# Patient Record
Sex: Male | Born: 1980 | Race: Black or African American | Hispanic: No | Marital: Single | Smoking: Current every day smoker
Health system: Southern US, Community
[De-identification: ages and names within clinical notes are randomized; demographics above are authoritative.]

---

## 2012-11-01 ENCOUNTER — Emergency Department: Payer: Self-pay | Admitting: Emergency Medicine

## 2012-11-02 LAB — BETA STREP CULTURE(ARMC)

## 2014-06-27 ENCOUNTER — Emergency Department: Payer: Self-pay | Admitting: Emergency Medicine

## 2014-06-30 ENCOUNTER — Emergency Department: Payer: Self-pay | Admitting: Emergency Medicine

## 2015-09-19 ENCOUNTER — Emergency Department: Payer: Self-pay

## 2015-09-19 ENCOUNTER — Encounter: Payer: Self-pay | Admitting: Emergency Medicine

## 2015-09-19 ENCOUNTER — Emergency Department
Admission: EM | Admit: 2015-09-19 | Discharge: 2015-09-19 | Disposition: A | Discharge status: Home / Self Care | Payer: Self-pay | Attending: Emergency Medicine | Admitting: Emergency Medicine

## 2015-09-19 DIAGNOSIS — J209 Acute bronchitis, unspecified: Secondary | ICD-10-CM | POA: Insufficient documentation

## 2015-09-19 DIAGNOSIS — M791 Myalgia: Secondary | ICD-10-CM | POA: Insufficient documentation

## 2015-09-19 DIAGNOSIS — R61 Generalized hyperhidrosis: Secondary | ICD-10-CM | POA: Insufficient documentation

## 2015-09-19 LAB — RAPID INFLUENZA A&B ANTIGENS: Influenza B (ARMC): NOT DETECTED

## 2015-09-19 LAB — RAPID INFLUENZA A&B ANTIGENS (ARMC ONLY): INFLUENZA A (ARMC): NOT DETECTED

## 2015-09-19 MED ORDER — IPRATROPIUM-ALBUTEROL 0.5-2.5 (3) MG/3ML IN SOLN
3.0000 mL | Freq: Once | RESPIRATORY_TRACT | Status: AC
  Administered 2015-09-19: 3 mL via RESPIRATORY_TRACT
  Filled 2015-09-19: qty 3

## 2015-09-19 MED ORDER — KETOROLAC TROMETHAMINE 30 MG/ML IJ SOLN
30.0000 mg | Freq: Once | INTRAMUSCULAR | Status: AC
  Administered 2015-09-19: 30 mg via INTRAMUSCULAR

## 2015-09-19 MED ORDER — KETOROLAC TROMETHAMINE 30 MG/ML IJ SOLN
30.0000 mg | Freq: Once | INTRAMUSCULAR | Status: DC
  Filled 2015-09-19: qty 1

## 2015-09-19 MED ORDER — NAPROXEN 500 MG PO TABS
500.0000 mg | ORAL_TABLET | Freq: Two times a day (BID) | ORAL | Status: DC
Start: 2015-09-19 — End: 2016-05-02

## 2015-09-19 MED ORDER — ALBUTEROL SULFATE HFA 108 (90 BASE) MCG/ACT IN AERS: 2.0000 | INHALATION_SPRAY | RESPIRATORY_TRACT | Status: DC | PRN

## 2015-09-19 MED ORDER — ONDANSETRON 8 MG PO TBDP
8.0000 mg | ORAL_TABLET | Freq: Once | ORAL | Status: AC
  Administered 2015-09-19: 8 mg via ORAL
  Filled 2015-09-19: qty 1

## 2015-09-19 MED ORDER — AZITHROMYCIN 250 MG PO TABS: ORAL_TABLET | ORAL | Status: DC

## 2015-09-19 MED ORDER — DEXAMETHASONE SODIUM PHOSPHATE 10 MG/ML IJ SOLN
10.0000 mg | Freq: Once | INTRAMUSCULAR | Status: AC
  Administered 2015-09-19: 10 mg via INTRAMUSCULAR
  Filled 2015-09-19: qty 1

## 2015-09-19 NOTE — ED Notes (Signed)
Reports cough and congestion and fever

## 2015-09-19 NOTE — Discharge Instructions (Signed)

## 2015-09-19 NOTE — ED Provider Notes (Signed)
[facility] Emergency Department Provider Note  ____________________________________________  Time seen: 5:20 PM  I have reviewed the triage vital signs and the nursing notes.   HISTORY  Chief Complaint Cough    HPI Steven Caldwell is a 35 y.o. male who complains of body aches nausea vomiting sweats and feeling flushed. He also reports some abdominal cramping. Feels like he has the flu. Also having a nonproductive cough.     History reviewed. No pertinent past medical history.   There are no active problems to display for this patient.    History reviewed. No pertinent past surgical history.   Current Outpatient Rx  Name  Route  Sig  Dispense  Refill  . albuterol (PROVENTIL HFA) 108 (90 Base) MCG/ACT inhaler   Inhalation   Inhale 2 puffs into the lungs every 4 (four) hours as needed for wheezing or shortness of breath.   1 Inhaler   0   . azithromycin (ZITHROMAX Z-PAK) 250 MG tablet      Take 2 tablets (500 mg) on  Day 1,  followed by 1 tablet (250 mg) once daily on Days 2 through 5.   6 each   0   . naproxen (NAPROSYN) 500 MG tablet   Oral   Take 1 tablet (500 mg total) by mouth 2 (two) times daily with a meal.   20 tablet   0      Allergies Review of patient's allergies indicates no known allergies.   History reviewed. No pertinent family history.  Social History Social History  Substance Use Topics  . Smoking status: Never Smoker   . Smokeless tobacco: None  . Alcohol Use: None    Review of Systems  Constitutional:   Positive fever and chills. Positive sweats.. No weight changes Eyes:   No blurry vision or double vision.  ENT:   As a sore throat and runny nose  Cardiovascular:   No chest pain. Respiratory:   Positive shortness of breath and nonproductive cough. Gastrointestinal:   Negative for abdominal pain, vomiting and diarrhea.  No BRBPR or melena. Genitourinary:   Negative for dysuria or difficulty  urinating. Musculoskeletal:   Positive diffuse myalgias Skin:   Negative for rash. Neurological:   Negative for headaches, focal weakness or numbness. Psychiatric:  No anxiety or depression.   Endocrine:  No changes in energy or sleep difficulty.  10-point ROS otherwise negative.  ____________________________________________   PHYSICAL EXAM:  VITAL SIGNS: ED Triage Vitals  Enc Vitals Group     BP 09/19/15 1710 125/85 mmHg     Pulse Rate 09/19/15 1710 87     Resp 09/19/15 1710 18     Temp 09/19/15 1710 98.9 F (37.2 C)     Temp Source 09/19/15 1710 Oral     SpO2 09/19/15 1710 99 %     Weight 09/19/15 1710 230 lb (104.327 kg)     Height 09/19/15 1710 6' (1.829 m)     Head Cir --      Peak Flow --      Pain Score 09/19/15 1714 5     Pain Loc --      Pain Edu? --      Excl. in GC? --     Vital signs reviewed, nursing assessments reviewed.   Constitutional:   Alert and oriented. Well appearing and in no distress. Eyes:   No scleral icterus. No conjunctival pallor. PERRL. EOMI ENT   Head:   Normocephalic and atraumatic.  Nose:   No congestion/rhinnorhea. No septal hematoma   Mouth/Throat:   MMM, positive pharyngeal erythema. No peritonsillar mass.    Neck:   No stridor. No SubQ emphysema. No meningismus. Hematological/Lymphatic/Immunilogical:   No cervical lymphadenopathy. Cardiovascular:   RRR. Symmetric bilateral radial and DP pulses.  No murmurs.  Respiratory:   Normal respiratory effort without tachypnea nor retractions. Breath sounds are clear and equal bilaterally. No wheezes/rales/rhonchi with normal breathing, but with forceful rapid exhalation, there is inducible wheezing and coughing.. Gastrointestinal:   Soft and nontender. Non distended. There is no CVA tenderness.  No rebound, rigidity, or guarding. Genitourinary:   deferred Musculoskeletal:   Nontender with normal range of motion in all extremities. No joint effusions.  No lower extremity  tenderness.  No edema. Neurologic:   Normal speech and language.  CN 2-10 normal. Motor grossly intact. No gross focal neurologic deficits are appreciated.  Skin:    Skin is warm, dry and intact. No rash noted.  No petechiae, purpura, or bullae. Psychiatric:   Mood and affect are normal. No SI or hallucinations ____________________________________________    LABS (pertinent positives/negatives) (all labs ordered are listed, but only abnormal results are displayed) Labs Reviewed  RAPID INFLUENZA A&B ANTIGENS ([facility] ONLY)   ____________________________________________   EKG    ____________________________________________    RADIOLOGY  Chest x-ray unremarkable  ____________________________________________   PROCEDURES   ____________________________________________   INITIAL IMPRESSION / ASSESSMENT AND PLAN / ED COURSE  Pertinent labs & imaging results that were available during my care of the patient were reviewed by me and considered in my medical decision making (see chart for details).  Patient presents with constellation of symptoms that is highly suspicious for viral upper respiratory infection and acute bronchitis, possibly influenza. We'll check flu swab and chest x-ray. DuoNeb's Decadron and Toradol.  ----------------------------------------- 6:59 PM on 09/19/2015 -----------------------------------------  Workup negative. We will prescribe NSAIDs albuterol and Z-Pak. Follow-up with primary care. Patient's well-appearing nontoxic, vital signs are normal. No evidence of sepsis. Low suspicion for any acute intra-abdominal pathology. Tolerating oral intake.     ____________________________________________   FINAL CLINICAL IMPRESSION(S) / ED DIAGNOSES  Final diagnoses:  Acute bronchitis, unspecified organism      Sharman Cheek, MD 09/19/15 1859

## 2015-09-19 NOTE — ED Notes (Signed)
Patient states that he has had body aches, vomiting, sweating, and "fever in my face". Patient also states that his "stomach has been locking up." Reports that he has not ate since Tuesday but that he is able to keep fluids down.

## 2016-03-11 ENCOUNTER — Emergency Department
Admission: EM | Admit: 2016-03-11 | Discharge: 2016-03-11 | Disposition: A | Discharge status: Home / Self Care | Payer: Self-pay | Attending: Emergency Medicine | Admitting: Emergency Medicine

## 2016-03-11 ENCOUNTER — Encounter: Payer: Self-pay | Admitting: Emergency Medicine

## 2016-03-11 DIAGNOSIS — B350 Tinea barbae and tinea capitis: Secondary | ICD-10-CM | POA: Insufficient documentation

## 2016-03-11 DIAGNOSIS — Z202 Contact with and (suspected) exposure to infections with a predominantly sexual mode of transmission: Secondary | ICD-10-CM | POA: Insufficient documentation

## 2016-03-11 DIAGNOSIS — F17210 Nicotine dependence, cigarettes, uncomplicated: Secondary | ICD-10-CM | POA: Insufficient documentation

## 2016-03-11 DIAGNOSIS — Z792 Long term (current) use of antibiotics: Secondary | ICD-10-CM | POA: Insufficient documentation

## 2016-03-11 DIAGNOSIS — R369 Urethral discharge, unspecified: Secondary | ICD-10-CM | POA: Insufficient documentation

## 2016-03-11 DIAGNOSIS — Z791 Long term (current) use of non-steroidal anti-inflammatories (NSAID): Secondary | ICD-10-CM | POA: Insufficient documentation

## 2016-03-11 LAB — URINALYSIS COMPLETE WITH MICROSCOPIC (ARMC ONLY)
Bacteria, UA: NONE SEEN
Bilirubin Urine: NEGATIVE
Glucose, UA: NEGATIVE mg/dL
KETONES UR: NEGATIVE mg/dL
NITRITE: NEGATIVE
PH: 5 (ref 5.0–8.0)
PROTEIN: 100 mg/dL — AB
SPECIFIC GRAVITY, URINE: 1.026 (ref 1.005–1.030)
Squamous Epithelial / LPF: NONE SEEN

## 2016-03-11 LAB — CHLAMYDIA/NGC RT PCR (ARMC ONLY)
Chlamydia Tr: DETECTED — AB
N gonorrhoeae: DETECTED — AB

## 2016-03-11 MED ORDER — CEFTRIAXONE SODIUM 250 MG IJ SOLR
250.0000 mg | Freq: Once | INTRAMUSCULAR | Status: AC
  Administered 2016-03-11: 250 mg via INTRAMUSCULAR
  Filled 2016-03-11: qty 250

## 2016-03-11 MED ORDER — AZITHROMYCIN 500 MG PO TABS
1000.0000 mg | ORAL_TABLET | Freq: Once | ORAL | Status: AC
  Administered 2016-03-11: 1000 mg via ORAL
  Filled 2016-03-11: qty 2

## 2016-03-11 MED ORDER — FLUCONAZOLE 150 MG PO TABS: 150.0000 mg | ORAL_TABLET | ORAL | 0 refills | Status: DC

## 2016-03-11 NOTE — ED Provider Notes (Signed)
[facility] Emergency Department Provider Note  ____________________________________________  Time seen: Approximately 4:08 PM  I have reviewed the triage vital signs and the nursing notes.   HISTORY  Chief Complaint Penile Discharge and Rash    HPI Steven Caldwell is a 35 y.o. male who presents to emergency department complaining of penile discharge and dysuria as well as a spreading skin lesion to the left side of the face. Per the patient discharge began yesterday.He reports burning with urination. He denies any polyuria, abdominal pain, nausea or vomiting, fevers or chills. Patient denies any known contact with STDs. Patient has not had these symptoms in the past. He has not tried any medications for these complaints prior to arrival.  Patient is also complaining of a skin lesion to the left side of the face/scalp. Patient reports that his child had ringworm and developed a similar-looking lesion to the left side of the face. He states that this has spread ingrown. He denies any drainage from site. He denies any pain. He reports the area is pruritic in nature. Only one lesion.    History reviewed. No pertinent past medical history.  There are no active problems to display for this patient.   History reviewed. No pertinent surgical history.  Prior to Admission medications   Medication Sig Start Date End Date Taking? Authorizing Provider  albuterol (PROVENTIL HFA) 108 (90 Base) MCG/ACT inhaler Inhale 2 puffs into the lungs every 4 (four) hours as needed for wheezing or shortness of breath. 09/19/15   Sharman CheekPhillip Stafford, MD  azithromycin (ZITHROMAX Z-PAK) 250 MG tablet Take 2 tablets (500 mg) on  Day 1,  followed by 1 tablet (250 mg) once daily on Days 2 through 5. 09/19/15   Sharman CheekPhillip Stafford, MD  fluconazole (DIFLUCAN) 150 MG tablet Take 1 tablet (150 mg total) by mouth once a week. 03/11/16   Delorise RoyalsJonathan D Emmaclaire Switala, PA-C  naproxen (NAPROSYN) 500 MG tablet Take 1  tablet (500 mg total) by mouth 2 (two) times daily with a meal. 09/19/15   Sharman CheekPhillip Stafford, MD    Allergies Review of patient's allergies indicates no known allergies.  No family history on file.  Social History Social History  Substance Use Topics  . Smoking status: Current Every Day Smoker    Packs/day: 1.00    Types: Cigarettes  . Smokeless tobacco: Never Used  . Alcohol use Yes     Comment: weekly     Review of Systems  Constitutional: No fever/chills Eyes: No visual changes.  ENT: No upper respiratory complaints. Cardiovascular: no chest pain. Respiratory: no cough. No SOB. Gastrointestinal: No abdominal pain.  No nausea, no vomiting.  No diarrhea.  No constipation. Genitourinary:Positive for penile discharge. Positive for dysuria. Negative for hematuria. No flank pain. Musculoskeletal: Negative for musculoskeletal pain. Skin: Positive for rash to the left side of face. Neurological: Negative for headaches, focal weakness or numbness. 10-point ROS otherwise negative.  ____________________________________________   PHYSICAL EXAM:  VITAL SIGNS: ED Triage Vitals  Enc Vitals Group     BP 03/11/16 1551 130/81     Pulse Rate 03/11/16 1551 94     Resp 03/11/16 1551 16     Temp 03/11/16 1551 98.1 F (36.7 C)     Temp Source 03/11/16 1551 Oral     SpO2 03/11/16 1551 98 %     Weight 03/11/16 1545 215 lb (97.5 kg)     Height 03/11/16 1545 6' (1.829 m)     Head Circumference --  Peak Flow --      Pain Score 03/11/16 1546 0     Pain Loc --      Pain Edu? --      Excl. in GC? --      Constitutional: Alert and oriented. Well appearing and in no acute distress. Eyes: Conjunctivae are normal. PERRL. EOMI. Head: Atraumatic. ENT:      Ears:       Nose: No congestion/rhinnorhea.      Mouth/Throat: Mucous membranes are moist.  Neck: No stridor.    Cardiovascular: Normal rate, regular rhythm. Normal S1 and S2.  Good peripheral circulation. Respiratory: Normal  respiratory effort without tachypnea or retractions. Lungs CTAB. Good air entry to the bases with no decreased or absent breath sounds. Gastrointestinal: Bowel sounds 4 quadrants. Soft and nontender to palpation. No guarding or rigidity. No palpable masses. No distention. No CVA tenderness. Genitourinary: No visible lesions. No chancres or sores. No discharge appreciated. No tenderness to palpation of testicles. Musculoskeletal: Full range of motion to all extremities. No gross deformities appreciated. Neurologic:  Normal speech and language. No gross focal neurologic deficits are appreciated.  Skin:  Skin is warm, dry and intact. Lesion noted to the left side of the scalp/face. Erythematous lesion. Edges are raised and scaly. Lesion is consistent with ringworm. Psychiatric: Mood and affect are normal. Speech and behavior are normal. Patient exhibits appropriate insight and judgement.   ____________________________________________   LABS (all labs ordered are listed, but only abnormal results are displayed)  Labs Reviewed  URINALYSIS COMPLETEWITH MICROSCOPIC ([facility] ONLY) - Abnormal; Notable for the following:       Result Value   Color, Urine YELLOW (*)    APPearance CLOUDY (*)    Hgb urine dipstick 1+ (*)    Protein, ur 100 (*)    Leukocytes, UA 3+ (*)    All other components within normal limits  CHLAMYDIA/NGC RT PCR ([facility] ONLY)   ____________________________________________  EKG   ____________________________________________  RADIOLOGY   No results found.  ____________________________________________    PROCEDURES  Procedure(s) performed:    Procedures    Medications  cefTRIAXone (ROCEPHIN) injection 250 mg (not administered)  azithromycin (ZITHROMAX) tablet 1,000 mg (not administered)     ____________________________________________   INITIAL IMPRESSION / ASSESSMENT AND PLAN / ED COURSE  Pertinent labs & imaging results that were available  during my care of the patient were reviewed by me and considered in my medical decision making (see chart for details).  Clinical Course    Patient's diagnosis is consistent with Ringworm of the scalp, penile discharge likely STD. Patient's urinalysis returns without any indication of UTI. Patient will be treated prophylactically for STD based off her symptomatology. Results from gonorrhea and chlamydia testing have not returned. When they do, we will call patient with results. Patient will follow-up with health Department in 3 weeks should results of gonorrhea and chlamydia returned positive. . Patient will be discharged home with prescriptions for fluconazole to be taken once weekly for 6 weeks for ringworm.  Patient is given ED precautions to return to the ED for any worsening or new symptoms.     ____________________________________________  FINAL CLINICAL IMPRESSION(S) / ED DIAGNOSES  Final diagnoses:  Ringworm of the scalp  Penile discharge  Possible exposure to STD      NEW MEDICATIONS STARTED DURING THIS VISIT:  New Prescriptions   FLUCONAZOLE (DIFLUCAN) 150 MG TABLET    Take 1 tablet (150 mg total) by mouth once a week.  This chart was dictated using voice recognition software/Dragon. Despite best efforts to proofread, errors can occur which can change the meaning. Any change was purely unintentional.    Racheal Patches, PA-C 03/11/16 1716    Myrna Blazer, MD 03/11/16 2106

## 2016-03-11 NOTE — ED Triage Notes (Signed)
Patient presents to the ED with a rash to his neck and his foot and penile discharge that began yesterday evening.  Patient reports discharge is green.  Denies foul odor.  Patient is giggling in triage.  Patient is in no obvious distress at this time.

## 2016-03-18 ENCOUNTER — Telehealth: Payer: Self-pay | Admitting: Emergency Medicine

## 2016-03-18 NOTE — Telephone Encounter (Signed)
Patient called because his girlfriend got a letter --I told him I had actually sent him a letter.  I explained positive gonorrhea and chlamydia tests and that he had been treated in the ED.

## 2016-05-02 ENCOUNTER — Emergency Department
Admission: EM | Admit: 2016-05-02 | Discharge: 2016-05-02 | Disposition: A | Discharge status: Home / Self Care | Payer: Self-pay | Attending: Student | Admitting: Student

## 2016-05-02 ENCOUNTER — Encounter: Payer: Self-pay | Admitting: Emergency Medicine

## 2016-05-02 DIAGNOSIS — R319 Hematuria, unspecified: Secondary | ICD-10-CM | POA: Insufficient documentation

## 2016-05-02 DIAGNOSIS — F17210 Nicotine dependence, cigarettes, uncomplicated: Secondary | ICD-10-CM | POA: Insufficient documentation

## 2016-05-02 DIAGNOSIS — J029 Acute pharyngitis, unspecified: Secondary | ICD-10-CM | POA: Insufficient documentation

## 2016-05-02 LAB — URINALYSIS COMPLETE WITH MICROSCOPIC (ARMC ONLY)
BILIRUBIN URINE: NEGATIVE
Bacteria, UA: NONE SEEN
GLUCOSE, UA: NEGATIVE mg/dL
HGB URINE DIPSTICK: NEGATIVE
Ketones, ur: NEGATIVE mg/dL
NITRITE: NEGATIVE
Protein, ur: 30 mg/dL — AB
SPECIFIC GRAVITY, URINE: 1.02 (ref 1.005–1.030)
pH: 8 (ref 5.0–8.0)

## 2016-05-02 LAB — POCT RAPID STREP A: STREPTOCOCCUS, GROUP A SCREEN (DIRECT): NEGATIVE

## 2016-05-02 MED ORDER — LIDOCAINE VISCOUS 2 % MT SOLN
10.0000 mL | OROMUCOSAL | 0 refills | Status: DC | PRN
Start: 2016-05-02 — End: 2019-03-15

## 2016-05-02 MED ORDER — AMOXICILLIN 875 MG PO TABS: 875.0000 mg | ORAL_TABLET | Freq: Two times a day (BID) | ORAL | 0 refills | Status: DC

## 2016-05-02 NOTE — ED Provider Notes (Signed)
[facility] Emergency Department Provider Note  ____________________________________________  Time seen: Approximately 10:00 AM  I have reviewed the triage vital signs and the nursing notes.   HISTORY  Chief Complaint Sore Throat    HPI Steven Caldwell is a 35 y.o. male , NAD, presents to the emergency department with 1 week history of sore throat. Patient states sore throat began one week ago with redness, swelling and white spots on the tonsils. States a friend gave him penicillin tablets to take which seemed to help for the first couple of days. States the sore throat returned this morning and he still notes redness and swelling but no white spots. Also notes he has had a rash about the side of his face which is new. Also reports since being in the emergency department he went to the restroom and noted blood in his urine with urinary hesitancy. Denies urethral discharge. States these symptoms have been going on for some time but states he is "hard headed" in regards to treatment and "does not like going to hospitals". Denies fevers, chills, body aches. Has had no headache, nasal congestion, ear pain, cough or chest congestion. No chest pain or shortness of breath. Denies abdominal pain, nausea or vomiting. No exposures to other sick contacts.   History reviewed. No pertinent past medical history.  There are no active problems to display for this patient.   No past surgical history on file.  Prior to Admission medications   Medication Sig Start Date End Date Taking? Authorizing Provider  amoxicillin (AMOXIL) 875 MG tablet Take 1 tablet (875 mg total) by mouth 2 (two) times daily. 05/02/16   Jami L Hagler, PA-C  lidocaine (XYLOCAINE) 2 % solution Use as directed 10 mLs in the mouth or throat every 4 (four) hours as needed for mouth pain. 05/02/16   Jami L Hagler, PA-C    Allergies Review of patient's allergies indicates no known allergies.  No family history  on file.  Social History Social History  Substance Use Topics  . Smoking status: Current Every Day Smoker    Packs/day: 1.00    Types: Cigarettes  . Smokeless tobacco: Never Used  . Alcohol use Yes     Comment: weekly     Review of Systems  Constitutional: No fever/chills ENT: Positive sore throat. Negative nasal congestion, runny nose, ear pain Cardiovascular: No chest pain. Respiratory: No cough or chest congestion. No shortness of breath.  Gastrointestinal: No abdominal pain.  No nausea, vomiting.   Genitourinary: Positive hematuria, urinary hesitancy. Negative for dysuria, urethral discharge. No urinary urgency or increased frequency. Musculoskeletal: Negative for back pain.  Skin: Positive for rash left side of face. Neurological: Negative for headaches, focal weakness or numbness. 10-point ROS otherwise negative.  ____________________________________________   PHYSICAL EXAM:  VITAL SIGNS: ED Triage Vitals  Enc Vitals Group     BP 05/02/16 0758 139/83     Pulse Rate 05/02/16 0758 (!) 58     Resp 05/02/16 0758 18     Temp 05/02/16 0758 97.4 F (36.3 C)     Temp src --      SpO2 05/02/16 0758 98 %     Weight 05/02/16 0758 215 lb (97.5 kg)     Height 05/02/16 0758 6' (1.829 m)     Head Circumference --      Peak Flow --      Pain Score 05/02/16 0759 6     Pain Loc --  Pain Edu? --      Excl. in GC? --      Constitutional: Alert and oriented. Well appearing and in no acute distress. Eyes: Conjunctivae are normal Without icterus or injection Head: Atraumatic. ENT:      Ears: TMs visualized bilaterally without erythema, effusion, bulging.      Nose: No congestion/rhinnorhea.      Mouth/Throat: Pharynx with moderate erythema but no significant swelling and no exudate. Uvula is midline. Airway is patent. Mucous membranes are moist.  Neck: Supple with full range of motion. Trachea midline. Hematological/Lymphatic/Immunilogical: Positive left, anterior,  focal cervical lymphadenopathy with mild tenderness to palpation but is mobile. Cardiovascular: Normal rate, regular rhythm. Normal S1 and S2.  Good peripheral circulation. Respiratory: Normal respiratory effort without tachypnea or retractions. Lungs CTAB with breath sounds noted in all lung fields. Gastrointestinal: Soft and nontender. No distention. No CVA tenderness. Musculoskeletal: No lower extremity tenderness nor edema.  No joint effusions. Neurologic:  Normal speech and language. No gross focal neurologic deficits are appreciated.  Skin:  Skin is warm, dry and intact. Papular rash noted about the left side of the face without significant erythema but evidence of excoriation. Psychiatric: Mood and affect are normal. Speech and behavior are normal. Patient exhibits appropriate insight and judgement.   ____________________________________________   LABS (all labs ordered are listed, but only abnormal results are displayed)  Labs Reviewed  URINALYSIS COMPLETEWITH MICROSCOPIC ([facility] ONLY) - Abnormal; Notable for the following:       Result Value   Color, Urine YELLOW (*)    APPearance CLEAR (*)    Protein, ur 30 (*)    Leukocytes, UA TRACE (*)    Squamous Epithelial / LPF 0-5 (*)    All other components within normal limits  CULTURE, GROUP A STREP [facility]  URINE CULTURE  POCT RAPID STREP A   ____________________________________________  EKG  None ____________________________________________  RADIOLOGY  None ____________________________________________    PROCEDURES  Procedure(s) performed: None   Procedures   Medications - No data to display   ____________________________________________   INITIAL IMPRESSION / ASSESSMENT AND PLAN / ED COURSE  Pertinent labs & imaging results that were available during my care of the patient were reviewed by me and considered in my medical decision making (see chart for details).  Clinical Course  Comment By Time  All  lab results were discussed with patient and all questions answered.  Hope PigeonJami L Hagler, PA-C 10/02 1120    Patient's diagnosis is consistent with Acute pharyngitis. Urine culture was sent to assess for infection as trace leukocytes were noted on UA but without bacteria or nitrates. Patient will be discharged home with prescriptions for amoxicillin and lidocaine viscous to take as directed to treat for pharyngitis. Patient was advised to establish care with a primary care provider. [facility] if symptoms persist past this treatment course. Patient is given ED precautions to return to the ED for any worsening or new symptoms.    ____________________________________________  FINAL CLINICAL IMPRESSION(S) / ED DIAGNOSES  Final diagnoses:  Pharyngitis, unspecified etiology      NEW MEDICATIONS STARTED DURING THIS VISIT:  Discharge Medication List as of 05/02/2016 11:21 AM    START taking these medications   Details  amoxicillin (AMOXIL) 875 MG tablet Take 1 tablet (875 mg total) by mouth 2 (two) times daily., Starting Mon 05/02/2016, Print    lidocaine (XYLOCAINE) 2 % solution Use as directed 10 mLs in the mouth  or throat every 4 (four) hours as needed for mouth pain., Starting Mon 05/02/2016, Print             Ernestene Kiel Callahan, PA-C 05/02/16 1136    Gayla Doss, MD 05/02/16 [phone]

## 2016-05-02 NOTE — ED Notes (Signed)
States he developed a sore throat about 1 week ago  Pain eased off and returned this am  Throat red and slightly swollen

## 2016-05-02 NOTE — ED Triage Notes (Signed)
Pt c/o sore throat

## 2016-05-03 LAB — URINE CULTURE: Special Requests: NORMAL

## 2016-05-04 LAB — CULTURE, GROUP A STREP (THRC)

## 2016-07-31 IMAGING — CR DG CHEST 2V
1 series · 2 of 2 positions shown · non-contrast
Comparison: None.

CLINICAL DATA: Cough congestion body low-grade fever for 5 days

EXAM:
CHEST  2 VIEW

[Series 1: w chest pa · 0.14mm/px · 2 of 2 slices shown]
[im 1/2]
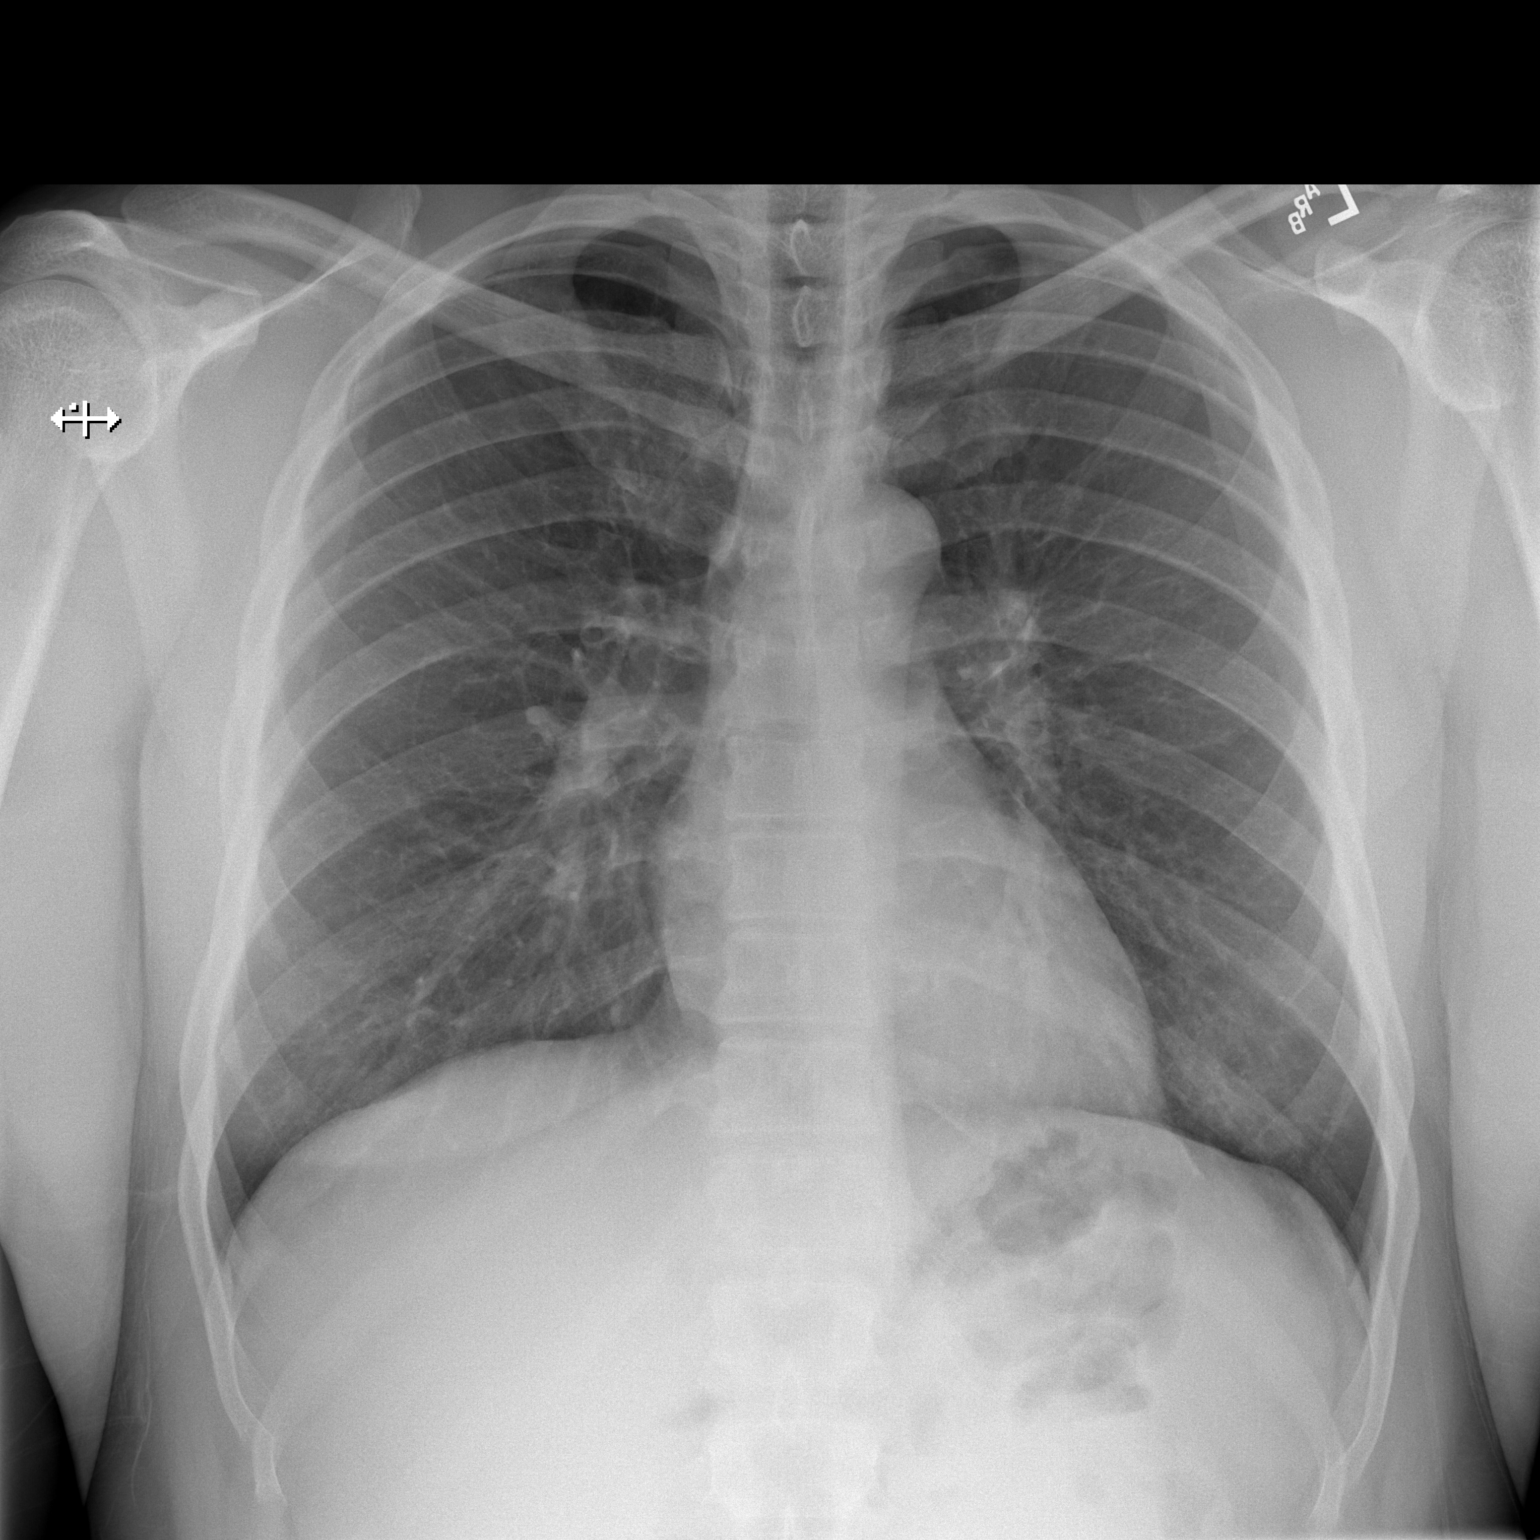
[im 2/2]
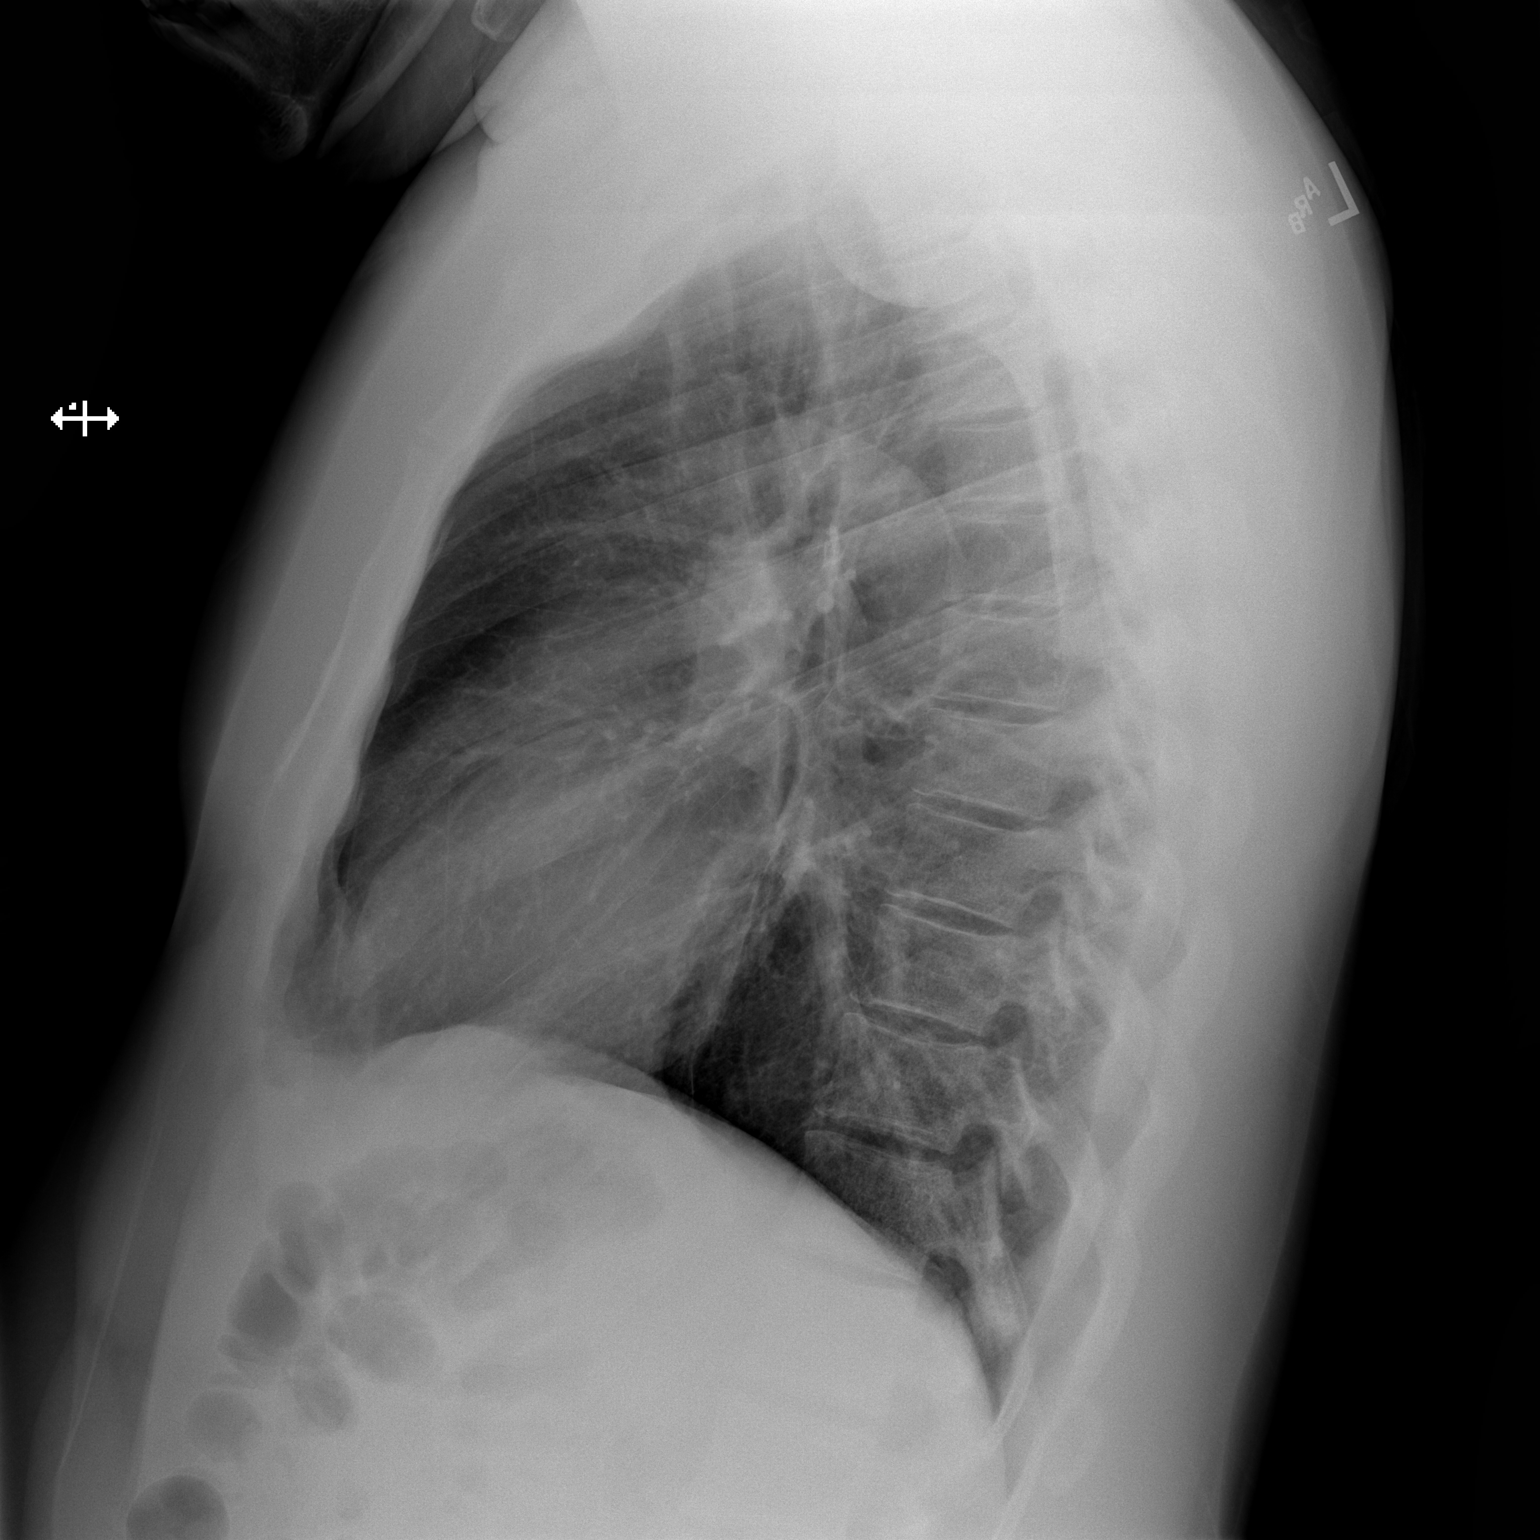

[2 of 2 positions shown; findings below may reference images not displayed]

FINDINGS: Heart size and vascular pattern are normal. No infiltrate or
effusion. Subtle attenuation over the left lower lung zone related
to overlying soft tissues.
IMPRESSION: No active cardiopulmonary disease.

## 2019-03-14 ENCOUNTER — Encounter: Payer: Self-pay | Admitting: Physician Assistant

## 2019-03-14 ENCOUNTER — Other Ambulatory Visit: Payer: Self-pay

## 2019-03-14 ENCOUNTER — Ambulatory Visit: Payer: Self-pay | Admitting: Physician Assistant

## 2019-03-14 DIAGNOSIS — Z113 Encounter for screening for infections with a predominantly sexual mode of transmission: Secondary | ICD-10-CM

## 2019-03-14 LAB — GRAM STAIN

## 2019-03-14 NOTE — Progress Notes (Signed)
Patient here for STD screening.Eron Goble Brewer-Jensen, RN 

## 2019-03-15 NOTE — Progress Notes (Signed)
    STI clinic/screening visit  Subjective:  Steven Caldwell is a 38 y.o. male being seen today for an STI screening visit. The patient reports they do not have symptoms.  Patient has the following medical conditions:  There are no active problems to display for this patient.    Chief Complaint  Patient presents with  . SEXUALLY TRANSMITTED DISEASE    HPI  Patient reports that he would like a screening since a partner is having symptoms.  Denies having any symptoms and states that his partner has an appointment soon for a screening.    See flowsheet for further details and programmatic requirements.    The following portions of the patient's history were reviewed and updated as appropriate: allergies, current medications, past medical history, past social history, past surgical history and problem list.  Objective:  There were no vitals filed for this visit.  Physical Exam Constitutional:      Appearance: Normal appearance.  HENT:     Head: Normocephalic and atraumatic.     Mouth/Throat:     Mouth: Mucous membranes are moist.     Pharynx: Oropharynx is clear. No oropharyngeal exudate or posterior oropharyngeal erythema.  Neck:     Musculoskeletal: Neck supple.  Pulmonary:     Effort: Pulmonary effort is normal.  Abdominal:     Palpations: Abdomen is soft. There is no mass.     Tenderness: There is no abdominal tenderness. There is no guarding or rebound.  Genitourinary:    Penis: Normal.      Scrotum/Testes: Normal.     Comments: Pubic area without nits, lice, edema, erythema, lesions and inguinal adenopathy. Penis circumcised and without discharge at meatus. Lymphadenopathy:     Cervical: No cervical adenopathy.  Skin:    General: Skin is warm and dry.     Findings: No bruising, erythema, lesion or rash.  Neurological:     Mental Status: He is alert and oriented to person, place, and time.  Psychiatric:        Mood and Affect: Mood normal.        Behavior:  Behavior normal.        Thought Content: Thought content normal.        Judgment: Judgment normal.       Assessment and Plan:  Steven Caldwell is a 38 y.o. male presenting to the [facility] Department for STI screening  1. Screening for STD (sexually transmitted disease) Patient is without symptoms today. Reviewed Gram stain with patient and no treatment is indicated today. Rec condoms with all sex. Rec that patient call once his partner has been screened if he has questions about whether he needs treatment or not.  Await test results.  Counseled that RN will call if needs to RTC for treatment once results are back.   - Gram stain - Chlamydia/Gonorrhea [facility] - HIV [facility] - Syphilis Serology, [facility]     No follow-ups on file.  No future appointments.  Jerene Dilling, PA

## 2019-03-20 ENCOUNTER — Ambulatory Visit: Payer: Self-pay

## 2019-03-20 ENCOUNTER — Other Ambulatory Visit: Payer: Self-pay

## 2019-03-20 DIAGNOSIS — Z202 Contact with and (suspected) exposure to infections with a predominantly sexual mode of transmission: Secondary | ICD-10-CM

## 2019-03-20 MED ORDER — AZITHROMYCIN 500 MG PO TABS
1000.0000 mg | ORAL_TABLET | Freq: Once | ORAL | Status: AC
  Administered 2019-03-20: 16:00:00 1000 mg via ORAL

## 2019-03-20 MED ORDER — CEFTRIAXONE SODIUM 250 MG IJ SOLR
250.0000 mg | Freq: Once | INTRAMUSCULAR | Status: AC
  Administered 2019-03-20: 250 mg via INTRAMUSCULAR

## 2019-03-20 NOTE — Progress Notes (Signed)
Patient reports partner's test was positive for [facility] and is requesting tx today. Consult with Criss Rosales PA- ok to tx patient per VO Aileen Fass, RN
